# Patient Record
Sex: Female | Born: 1980 | Race: White | Hispanic: No | Marital: Married | Smoking: Never smoker
Health system: Southern US, Community
[De-identification: ages and names within clinical notes are randomized; demographics above are authoritative.]

## PROBLEM LIST (undated history)

## (undated) DIAGNOSIS — Z8619 Personal history of other infectious and parasitic diseases: Secondary | ICD-10-CM

## (undated) HISTORY — DX: Personal history of other infectious and parasitic diseases: Z86.19

## (undated) HISTORY — PX: TONSILLECTOMY: SUR1361

---

## 2007-05-24 ENCOUNTER — Emergency Department (HOSPITAL_COMMUNITY): Admission: EM | Admit: 2007-05-24 | Discharge: 2007-05-24 | Payer: Self-pay | Admitting: Emergency Medicine

## 2008-11-21 ENCOUNTER — Inpatient Hospital Stay (HOSPITAL_COMMUNITY): Admission: AD | Admit: 2008-11-21 | Discharge: 2008-11-23 | Payer: Self-pay | Admitting: Obstetrics and Gynecology

## 2008-11-22 ENCOUNTER — Encounter (INDEPENDENT_AMBULATORY_CARE_PROVIDER_SITE_OTHER): Payer: Self-pay | Admitting: Obstetrics and Gynecology

## 2010-04-04 ENCOUNTER — Other Ambulatory Visit: Payer: Self-pay | Admitting: Obstetrics and Gynecology

## 2010-04-04 ENCOUNTER — Ambulatory Visit (HOSPITAL_COMMUNITY)
Admission: AD | Admit: 2010-04-04 | Discharge: 2010-04-04 | Disposition: A | Discharge status: Home / Self Care | Payer: BC Managed Care – PPO | Source: Ambulatory Visit | Attending: Obstetrics and Gynecology | Admitting: Obstetrics and Gynecology

## 2010-04-04 DIAGNOSIS — O034 Incomplete spontaneous abortion without complication: Secondary | ICD-10-CM | POA: Insufficient documentation

## 2010-04-04 LAB — ABO/RH: ABO/RH(D): A NEG

## 2010-04-04 LAB — CBC
Hemoglobin: 13.6 g/dL (ref 12.0–15.0)
RBC: 4.46 MIL/uL (ref 3.87–5.11)

## 2010-04-04 LAB — HCG, QUANTITATIVE, PREGNANCY: hCG, Beta Chain, Quant, S: 14385 m[IU]/mL — ABNORMAL HIGH (ref <5)

## 2010-04-05 LAB — RH IMMUNE GLOB WKUP(>/=20WKS)(NOT WOMEN'S HOSP): Antibody Screen: NEGATIVE

## 2010-04-08 NOTE — Op Note (Signed)
  NAMELASHAUNTA, SICARD                ACCOUNT NO.:  [id number]  MEDICAL RECORD NO.:  [id number]           PATIENT TYPE:  O  LOCATION:  [facility]                          [facility]  PHYSICIAN:  Hensley Treat L. Kameshia Madruga, M.D.DATE OF BIRTH:  Mar 15, 1980  DATE OF PROCEDURE: DATE OF DISCHARGE:                              OPERATIVE REPORT   PREOPERATIVE DIAGNOSIS:  Incomplete abortion, Rh negative.  POSTOPERATIVE DIAGNOSIS:  Incomplete abortion, Rh negative.  PROCEDURE:  Dilatation and aspiration  SURGEON:  Aydan Levitz L. Vincente Poli, MD  ANESTHESIA:  Spinal with local.  FINDINGS:  Products of conception sent to Pathology that is specimen.  ESTIMATED BLOOD LOSS:  Minimal.  COMPLICATIONS:  None.  DESCRIPTION OF PROCEDURE:  The patient was taken to the operating room. She was given a spinal by Dr. Jean Rosenthal.  She was prepped and draped in the usual sterile fashion.  In-and-out catheter was used to empty the bladder.  There is a large amount of blood and clots in the vagina.  The cervix was open about 1 cm.  She was actively bleeding in a moderate to heavy fashion.  A paracervical block was performed a #7 suction cannula was inserted to the uterus.  The uterus was thoroughly suctioned of moderate amount of tissue consistent with products of conception.  The curette was removed and a sharp curette was inserted and the uterus was thoroughly curetted of all tissue.  A final suction curettage revealed a clean endometrial cavity.  All instruments removed from the vagina and bleeding was minimal to none.  The patient was taken to the recovery room in stable condition.  All sponge, lap, and instrument counts were correct x2.     Stillman Buenger L. Vincente Poli, M.D.     Florestine Avers  D:  04/04/2010  T:  04/05/2010  Job:  [id number]  Electronically Signed by Marcelle Overlie M.D. on 04/08/2010 07:11:27 AM

## 2010-04-17 LAB — COMPREHENSIVE METABOLIC PANEL
Alkaline Phosphatase: 119 U/L — ABNORMAL HIGH (ref 39–117)
BUN: 12 mg/dL (ref 6–23)
Calcium: 9.4 mg/dL (ref 8.4–10.5)
Glucose, Bld: 80 mg/dL (ref 70–99)
Potassium: 4.2 mEq/L (ref 3.5–5.1)
Total Protein: 5.7 g/dL — ABNORMAL LOW (ref 6.0–8.3)

## 2010-04-17 LAB — CBC
HCT: 38.1 % (ref 36.0–46.0)
Hemoglobin: 13.2 g/dL (ref 12.0–15.0)
MCHC: 34.4 g/dL (ref 30.0–36.0)
MCHC: 34.5 g/dL (ref 30.0–36.0)
MCV: 95.9 fL (ref 78.0–100.0)
MCV: 98.3 fL (ref 78.0–100.0)
RBC: 3.16 MIL/uL — ABNORMAL LOW (ref 3.87–5.11)
RBC: 3.89 MIL/uL (ref 3.87–5.11)
RDW: 13.3 % (ref 11.5–15.5)
WBC: 12.8 10*3/uL — ABNORMAL HIGH (ref 4.0–10.5)

## 2010-04-17 LAB — RPR: RPR Ser Ql: NONREACTIVE

## 2010-04-17 LAB — LACTATE DEHYDROGENASE: LDH: 197 U/L (ref 94–250)

## 2011-02-10 ENCOUNTER — Ambulatory Visit (INDEPENDENT_AMBULATORY_CARE_PROVIDER_SITE_OTHER): Payer: BC Managed Care – PPO

## 2011-02-10 DIAGNOSIS — J019 Acute sinusitis, unspecified: Secondary | ICD-10-CM

## 2011-02-10 DIAGNOSIS — H6120 Impacted cerumen, unspecified ear: Secondary | ICD-10-CM

## 2011-02-10 DIAGNOSIS — J111 Influenza due to unidentified influenza virus with other respiratory manifestations: Secondary | ICD-10-CM

## 2011-04-15 LAB — OB RESULTS CONSOLE GC/CHLAMYDIA
Chlamydia: NEGATIVE
Gonorrhea: NEGATIVE

## 2011-04-15 LAB — OB RESULTS CONSOLE HIV ANTIBODY (ROUTINE TESTING): HIV: NONREACTIVE

## 2011-04-15 LAB — OB RESULTS CONSOLE RPR: RPR: NONREACTIVE

## 2011-10-30 LAB — OB RESULTS CONSOLE GBS: GBS: POSITIVE

## 2011-11-10 ENCOUNTER — Encounter (HOSPITAL_COMMUNITY): Payer: Self-pay | Admitting: *Deleted

## 2011-11-10 ENCOUNTER — Telehealth (HOSPITAL_COMMUNITY): Payer: Self-pay | Admitting: *Deleted

## 2011-11-10 NOTE — Telephone Encounter (Signed)
Preadmission screen  

## 2011-11-14 ENCOUNTER — Inpatient Hospital Stay (HOSPITAL_COMMUNITY): Admission: AD | Admit: 2011-11-14 | Payer: Self-pay | Source: Ambulatory Visit | Admitting: Obstetrics and Gynecology

## 2011-11-18 ENCOUNTER — Encounter (HOSPITAL_COMMUNITY): Payer: Self-pay

## 2011-11-18 ENCOUNTER — Inpatient Hospital Stay (HOSPITAL_COMMUNITY)
Admission: RE | Admit: 2011-11-18 | Discharge: 2011-11-19 | DRG: 373 | Disposition: A | Discharge status: Home / Self Care | Payer: BC Managed Care – PPO | Source: Ambulatory Visit | Attending: Obstetrics and Gynecology | Admitting: Obstetrics and Gynecology

## 2011-11-18 DIAGNOSIS — O99892 Other specified diseases and conditions complicating childbirth: Secondary | ICD-10-CM | POA: Diagnosis present

## 2011-11-18 DIAGNOSIS — Z22330 Carrier of Group B streptococcus: Secondary | ICD-10-CM

## 2011-11-18 LAB — CBC
HCT: 37 % (ref 36.0–46.0)
MCH: 32.6 pg (ref 26.0–34.0)
MCV: 92.7 fL (ref 78.0–100.0)
Platelets: 147 10*3/uL — ABNORMAL LOW (ref 150–400)
RDW: 13.1 % (ref 11.5–15.5)

## 2011-11-18 MED ORDER — BENZOCAINE-MENTHOL 20-0.5 % EX AERO: 1.0000 "application " | INHALATION_SPRAY | CUTANEOUS | Status: DC | PRN

## 2011-11-18 MED ORDER — OXYTOCIN 40 UNITS IN LACTATED RINGERS INFUSION - SIMPLE MED: 62.5000 mL/h | INTRAVENOUS | Status: DC

## 2011-11-18 MED ORDER — ONDANSETRON HCL 4 MG/2ML IJ SOLN: 4.0000 mg | Freq: Four times a day (QID) | INTRAMUSCULAR | Status: DC | PRN

## 2011-11-18 MED ORDER — OXYCODONE-ACETAMINOPHEN 5-325 MG PO TABS: 1.0000 | ORAL_TABLET | ORAL | Status: DC | PRN

## 2011-11-18 MED ORDER — ACETAMINOPHEN 325 MG PO TABS: 650.0000 mg | ORAL_TABLET | ORAL | Status: DC | PRN

## 2011-11-18 MED ORDER — LANOLIN HYDROUS EX OINT: TOPICAL_OINTMENT | CUTANEOUS | Status: DC | PRN

## 2011-11-18 MED ORDER — TERBUTALINE SULFATE 1 MG/ML IJ SOLN: 0.2500 mg | Freq: Once | INTRAMUSCULAR | Status: DC | PRN

## 2011-11-18 MED ORDER — LACTATED RINGERS IV SOLN: 500.0000 mL | INTRAVENOUS | Status: DC | PRN

## 2011-11-18 MED ORDER — MEASLES, MUMPS & RUBELLA VAC ~~LOC~~ INJ: 0.5000 mL | INJECTION | Freq: Once | SUBCUTANEOUS | Status: DC

## 2011-11-18 MED ORDER — ONDANSETRON HCL 4 MG/2ML IJ SOLN: 4.0000 mg | INTRAMUSCULAR | Status: DC | PRN

## 2011-11-18 MED ORDER — ONDANSETRON HCL 4 MG PO TABS: 4.0000 mg | ORAL_TABLET | ORAL | Status: DC | PRN

## 2011-11-18 MED ORDER — LACTATED RINGERS IV SOLN: INTRAVENOUS | Status: DC

## 2011-11-18 MED ORDER — DIPHENHYDRAMINE HCL 25 MG PO CAPS: 25.0000 mg | ORAL_CAPSULE | Freq: Four times a day (QID) | ORAL | Status: DC | PRN

## 2011-11-18 MED ORDER — WITCH HAZEL-GLYCERIN EX PADS: 1.0000 "application " | MEDICATED_PAD | CUTANEOUS | Status: DC | PRN

## 2011-11-18 MED ORDER — PRENATAL MULTIVITAMIN CH
1.0000 | ORAL_TABLET | Freq: Every day | ORAL | Status: DC
  Administered 2011-11-19: 1 via ORAL
  Filled 2011-11-18: qty 1

## 2011-11-18 MED ORDER — IBUPROFEN 600 MG PO TABS
600.0000 mg | ORAL_TABLET | Freq: Four times a day (QID) | ORAL | Status: DC
  Administered 2011-11-19: 600 mg via ORAL
  Filled 2011-11-18 (×2): qty 1

## 2011-11-18 MED ORDER — PENICILLIN G POTASSIUM 5000000 UNITS IJ SOLR
2.5000 10*6.[IU] | INTRAVENOUS | Status: DC
  Administered 2011-11-18: 2.5 10*6.[IU] via INTRAVENOUS
  Filled 2011-11-18 (×5): qty 2.5

## 2011-11-18 MED ORDER — SENNOSIDES-DOCUSATE SODIUM 8.6-50 MG PO TABS: 2.0000 | ORAL_TABLET | Freq: Every day | ORAL | Status: DC

## 2011-11-18 MED ORDER — OXYTOCIN 40 UNITS IN LACTATED RINGERS INFUSION - SIMPLE MED
1.0000 m[IU]/min | INTRAVENOUS | Status: DC
  Administered 2011-11-18: 2 m[IU]/min via INTRAVENOUS
  Administered 2011-11-18: 4 m[IU]/min via INTRAVENOUS
  Filled 2011-11-18: qty 1000

## 2011-11-18 MED ORDER — PENICILLIN G POTASSIUM 5000000 UNITS IJ SOLR
5.0000 10*6.[IU] | Freq: Once | INTRAVENOUS | Status: AC
  Administered 2011-11-18: 5 10*6.[IU] via INTRAVENOUS
  Filled 2011-11-18: qty 5

## 2011-11-18 MED ORDER — MEDROXYPROGESTERONE ACETATE 150 MG/ML IM SUSP: 150.0000 mg | INTRAMUSCULAR | Status: DC | PRN

## 2011-11-18 MED ORDER — SIMETHICONE 80 MG PO CHEW: 80.0000 mg | CHEWABLE_TABLET | ORAL | Status: DC | PRN

## 2011-11-18 MED ORDER — OXYTOCIN BOLUS FROM INFUSION
500.0000 mL | INTRAVENOUS | Status: DC
Start: 2011-11-18 — End: 2011-11-18

## 2011-11-18 MED ORDER — PROMETHAZINE HCL 25 MG/ML IJ SOLN: 12.5000 mg | Freq: Four times a day (QID) | INTRAMUSCULAR | Status: DC | PRN

## 2011-11-18 MED ORDER — LIDOCAINE HCL (PF) 1 % IJ SOLN
30.0000 mL | INTRAMUSCULAR | Status: DC | PRN
  Filled 2011-11-18: qty 30

## 2011-11-18 MED ORDER — IBUPROFEN 600 MG PO TABS
600.0000 mg | ORAL_TABLET | Freq: Four times a day (QID) | ORAL | Status: DC | PRN
  Administered 2011-11-18: 600 mg via ORAL
  Filled 2011-11-18: qty 1

## 2011-11-18 MED ORDER — BUTORPHANOL TARTRATE 1 MG/ML IJ SOLN: 1.0000 mg | INTRAMUSCULAR | Status: DC | PRN

## 2011-11-18 MED ORDER — TETANUS-DIPHTH-ACELL PERTUSSIS 5-2.5-18.5 LF-MCG/0.5 IM SUSP: 0.5000 mL | Freq: Once | INTRAMUSCULAR | Status: DC

## 2011-11-18 MED ORDER — CITRIC ACID-SODIUM CITRATE 334-500 MG/5ML PO SOLN: 30.0000 mL | ORAL | Status: DC | PRN

## 2011-11-18 MED ORDER — DIBUCAINE 1 % RE OINT: 1.0000 "application " | TOPICAL_OINTMENT | RECTAL | Status: DC | PRN

## 2011-11-18 NOTE — Progress Notes (Signed)
Pt getting more uncomfortable w/ ctx.  Declines epidural but desires IV pain meds  FHT reassuring toco q2-3 Cvx 3cm (1hr ago per RN exam)  A/P:  Continue pitocin IOL Stadol prn

## 2011-11-18 NOTE — H&P (Signed)
31 yo G3P1 @ 39+3 wks presents for IOL because of elevating BP & favorable cervix.  BP in office 130-140/90s No HA, N/V or visual changes.  No regular ctx.  No lof or vb.  + FM  Past History - see hollister, GBS +  H/o SVD  AF, VSS  BP 130/90 Gen - NAD Abd - gravid, NT  EFW 7 1/2# Ext -  2-3+edema, DTR 2+, 1 beat clonus CVX - 3cm, arom - clear  A/P:  Admit AROM - clear Pitocin prn  PCN

## 2011-11-18 NOTE — Plan of Care (Signed)
Problem: Phase I Progression Outcomes Goal: Initial discharge plan identified Outcome: Completed/Met Date Met:  11/18/11 Parents wish to go home with newborn infant tonight and are waiting for pediatrician to check infant. OB MD notified that they wish to leave tonight possibly AMA. Goal: Other Phase I Outcomes/Goals Outcome: Progressing Patients feet and ankles are swollen and admitting B/P elevated.

## 2011-11-18 NOTE — Progress Notes (Signed)
Father and mother request discharge ASAP and do not want to wait until 24 hrs.
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
## 2011-11-18 NOTE — Progress Notes (Signed)
Pt feeling increasing pressure w/ ctx  FHT reassuring w/ small early decels.  Good BTBV Toco Q2 Cvx 8-9/90/0  A/P:  Exp mngt

## 2011-11-19 LAB — CBC
MCV: 93.2 fL (ref 78.0–100.0)
Platelets: 147 10*3/uL — ABNORMAL LOW (ref 150–400)
RBC: 3.84 MIL/uL — ABNORMAL LOW (ref 3.87–5.11)
RDW: 13.2 % (ref 11.5–15.5)
WBC: 14.3 10*3/uL — ABNORMAL HIGH (ref 4.0–10.5)

## 2011-11-19 NOTE — Progress Notes (Signed)
Post Partum Day 1 Subjective: no complaints, up ad lib, voiding, tolerating PO and + flatus. States husband is calmer. A friend plans to pick up patient today for discharge. Desires baby circ  Objective: Blood pressure 130/77, pulse 88, temperature 98.3 F (36.8 C), temperature source Oral, resp. rate 18, height 5\' 4"  (1.626 m), last menstrual period 02/15/2011, unknown if currently breastfeeding.  Physical Exam:  General: alert and cooperative Lochia: appropriate Uterine Fundus: firm Incision: perineum intact DVT Evaluation: No evidence of DVT seen on physical exam. No cords or calf tenderness. No significant calf/ankle edema.   Basename 11/19/11 0530 11/18/11 0725  HGB 12.5 13.0  HCT 35.8* 37.0    Assessment/Plan: Discharge home   LOS: 1 day   CURTIS,CAROL G 11/19/2011, 8:41 AM

## 2011-11-19 NOTE — Discharge Summary (Signed)
Obstetric Discharge Summary Reason for Admission: induction of labor Prenatal Procedures: NST and ultrasound Intrapartum Procedures: spontaneous vaginal delivery Postpartum Procedures: none Complications-Operative and Postpartum: 2 degree perineal laceration Hemoglobin  Date Value Range Status  11/19/2011 12.5  12.0 - 15.0 g/dL Final     HCT  Date Value Range Status  11/19/2011 35.8* 36.0 - 46.0 % Final    Physical Exam:  General: alert and cooperative Lochia: appropriate Uterine Fundus: firm Incision: perineum intact DVT Evaluation: No evidence of DVT seen on physical exam. Negative Homan's sign. No cords or calf tenderness. No significant calf/ankle edema.  Discharge Diagnoses: Term Pregnancy-delivered  Discharge Information: Date: 11/19/2011 Activity: pelvic rest Diet: routine Medications: PNV and Ibuprofen Condition: stable Instructions: refer to practice specific booklet Discharge to: home   Newborn Data: Live born female  Birth Weight: 7 lb 10 oz (3459 g) APGAR: 9, 9  Home with mother.  CURTIS,CAROL G 11/19/2011, 8:45 AM
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
## 2013-11-14 ENCOUNTER — Encounter (HOSPITAL_COMMUNITY): Payer: Self-pay

## 2016-06-05 DIAGNOSIS — Z0000 Encounter for general adult medical examination without abnormal findings: Secondary | ICD-10-CM | POA: Diagnosis not present

## 2016-06-05 DIAGNOSIS — Z131 Encounter for screening for diabetes mellitus: Secondary | ICD-10-CM | POA: Diagnosis not present

## 2016-06-05 DIAGNOSIS — Z01419 Encounter for gynecological examination (general) (routine) without abnormal findings: Secondary | ICD-10-CM | POA: Diagnosis not present

## 2016-06-05 DIAGNOSIS — Z6829 Body mass index (BMI) 29.0-29.9, adult: Secondary | ICD-10-CM | POA: Diagnosis not present

## 2016-06-05 DIAGNOSIS — Z1329 Encounter for screening for other suspected endocrine disorder: Secondary | ICD-10-CM | POA: Diagnosis not present

## 2016-06-05 DIAGNOSIS — Z130 Encounter for screening for diseases of the blood and blood-forming organs and certain disorders involving the immune mechanism: Secondary | ICD-10-CM | POA: Diagnosis not present

## 2016-06-05 DIAGNOSIS — Z13220 Encounter for screening for lipoid disorders: Secondary | ICD-10-CM | POA: Diagnosis not present

## 2016-06-05 DIAGNOSIS — Z1151 Encounter for screening for human papillomavirus (HPV): Secondary | ICD-10-CM | POA: Diagnosis not present

## 2016-07-24 DIAGNOSIS — M79621 Pain in right upper arm: Secondary | ICD-10-CM | POA: Diagnosis not present

## 2016-07-24 DIAGNOSIS — L249 Irritant contact dermatitis, unspecified cause: Secondary | ICD-10-CM | POA: Diagnosis not present

## 2016-07-24 DIAGNOSIS — M25511 Pain in right shoulder: Secondary | ICD-10-CM | POA: Diagnosis not present

## 2016-07-24 DIAGNOSIS — W57XXXA Bitten or stung by nonvenomous insect and other nonvenomous arthropods, initial encounter: Secondary | ICD-10-CM | POA: Diagnosis not present

## 2017-02-25 DIAGNOSIS — Z30433 Encounter for removal and reinsertion of intrauterine contraceptive device: Secondary | ICD-10-CM | POA: Diagnosis not present

## 2017-02-25 DIAGNOSIS — Z3202 Encounter for pregnancy test, result negative: Secondary | ICD-10-CM | POA: Diagnosis not present

## 2017-04-08 DIAGNOSIS — Z6829 Body mass index (BMI) 29.0-29.9, adult: Secondary | ICD-10-CM | POA: Diagnosis not present

## 2017-04-08 DIAGNOSIS — Z01419 Encounter for gynecological examination (general) (routine) without abnormal findings: Secondary | ICD-10-CM | POA: Diagnosis not present

## 2017-12-23 DIAGNOSIS — R590 Localized enlarged lymph nodes: Secondary | ICD-10-CM | POA: Diagnosis not present

## 2018-02-09 ENCOUNTER — Other Ambulatory Visit: Payer: Self-pay | Admitting: Obstetrics

## 2018-02-09 DIAGNOSIS — R52 Pain, unspecified: Secondary | ICD-10-CM

## 2018-02-09 DIAGNOSIS — R609 Edema, unspecified: Secondary | ICD-10-CM

## 2018-02-12 ENCOUNTER — Ambulatory Visit
Admission: RE | Admit: 2018-02-12 | Discharge: 2018-02-12 | Disposition: A | Discharge status: Home / Self Care | Payer: BLUE CROSS/BLUE SHIELD | Source: Ambulatory Visit | Attending: Obstetrics | Admitting: Obstetrics

## 2018-02-12 DIAGNOSIS — R52 Pain, unspecified: Secondary | ICD-10-CM

## 2018-02-12 DIAGNOSIS — R609 Edema, unspecified: Secondary | ICD-10-CM

## 2018-02-12 DIAGNOSIS — N6489 Other specified disorders of breast: Secondary | ICD-10-CM | POA: Diagnosis not present

## 2018-02-12 DIAGNOSIS — R922 Inconclusive mammogram: Secondary | ICD-10-CM | POA: Diagnosis not present

## 2018-07-26 DIAGNOSIS — W57XXXA Bitten or stung by nonvenomous insect and other nonvenomous arthropods, initial encounter: Secondary | ICD-10-CM | POA: Diagnosis not present

## 2018-07-26 DIAGNOSIS — L237 Allergic contact dermatitis due to plants, except food: Secondary | ICD-10-CM | POA: Diagnosis not present

## 2018-07-26 DIAGNOSIS — L03115 Cellulitis of right lower limb: Secondary | ICD-10-CM | POA: Diagnosis not present

## 2018-09-23 DIAGNOSIS — M7582 Other shoulder lesions, left shoulder: Secondary | ICD-10-CM | POA: Diagnosis not present

## 2019-10-18 IMAGING — MG DIGITAL DIAGNOSTIC BILATERAL MAMMOGRAM WITH TOMO AND CAD
6 of 10 series · 6 of 30 positions shown · non-contrast
Comparison: Previous exam(s).

CLINICAL DATA: 38-year-old female with right axillary swelling and
tenderness for approximately 1 month which has since improved.

EXAM:
DIGITAL DIAGNOSTIC BILATERAL MAMMOGRAM WITH CAD AND TOMO
ULTRASOUND RIGHT BREAST

[R MLO synth-2D]
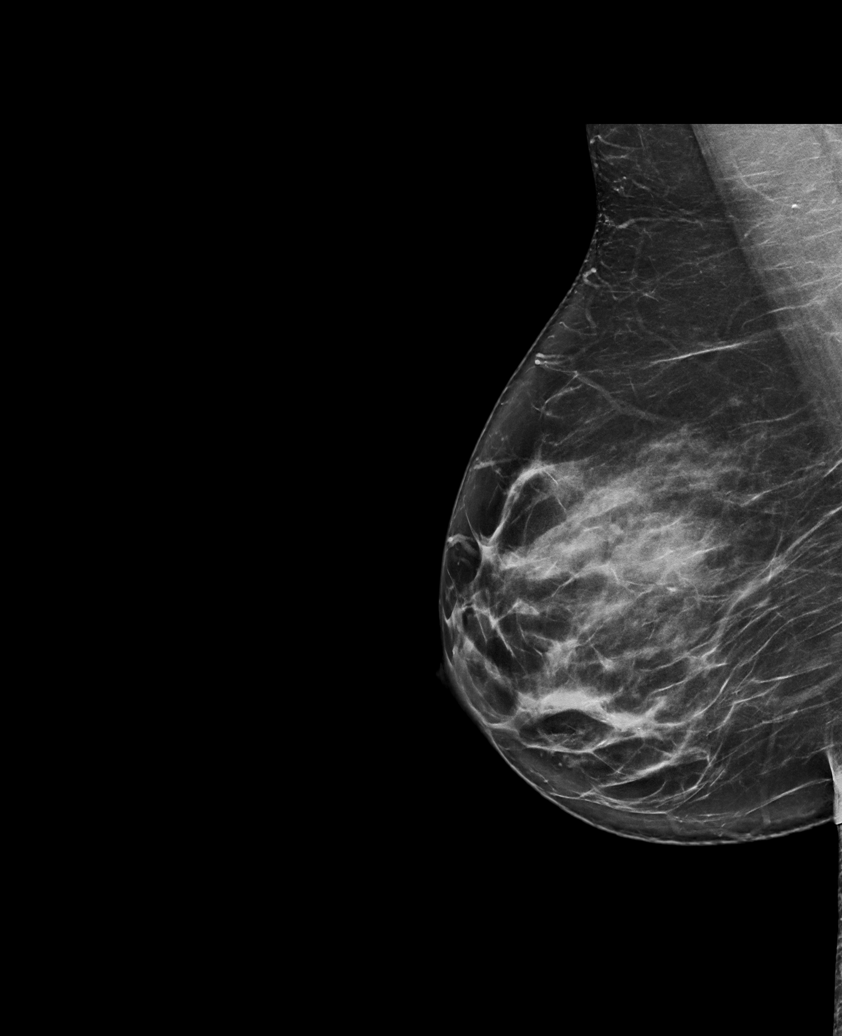

[R CC synth-2D]
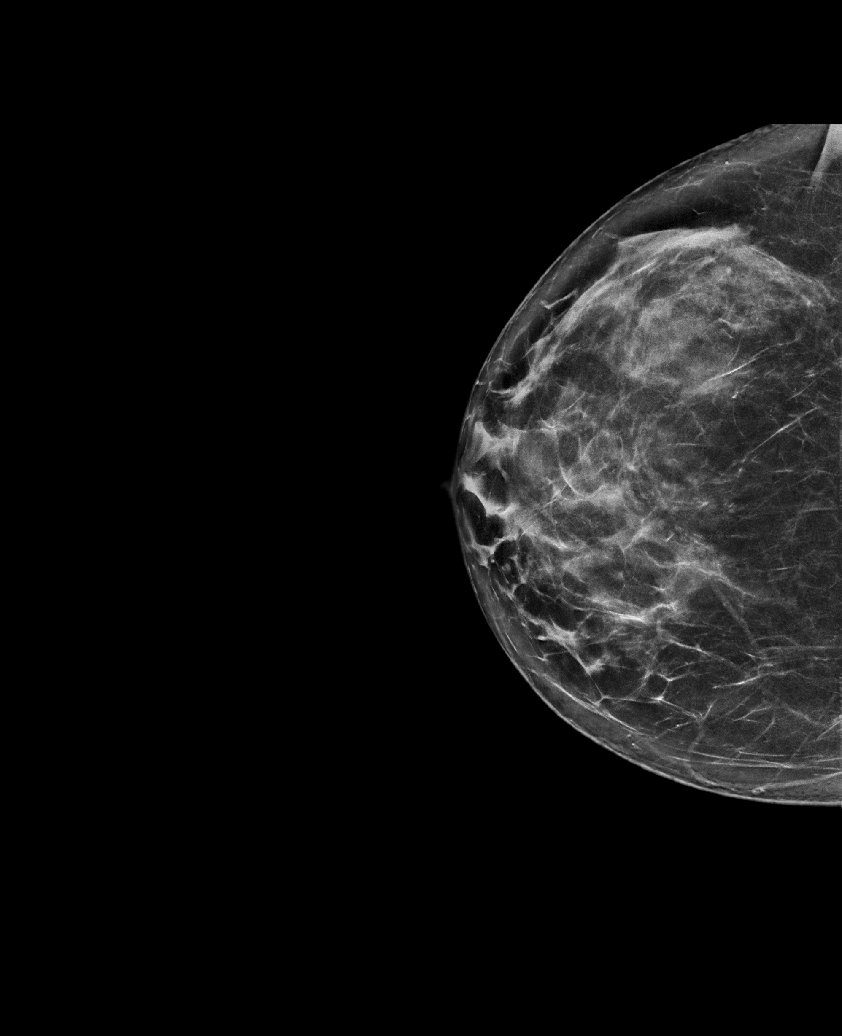

[L CC synth-2D]
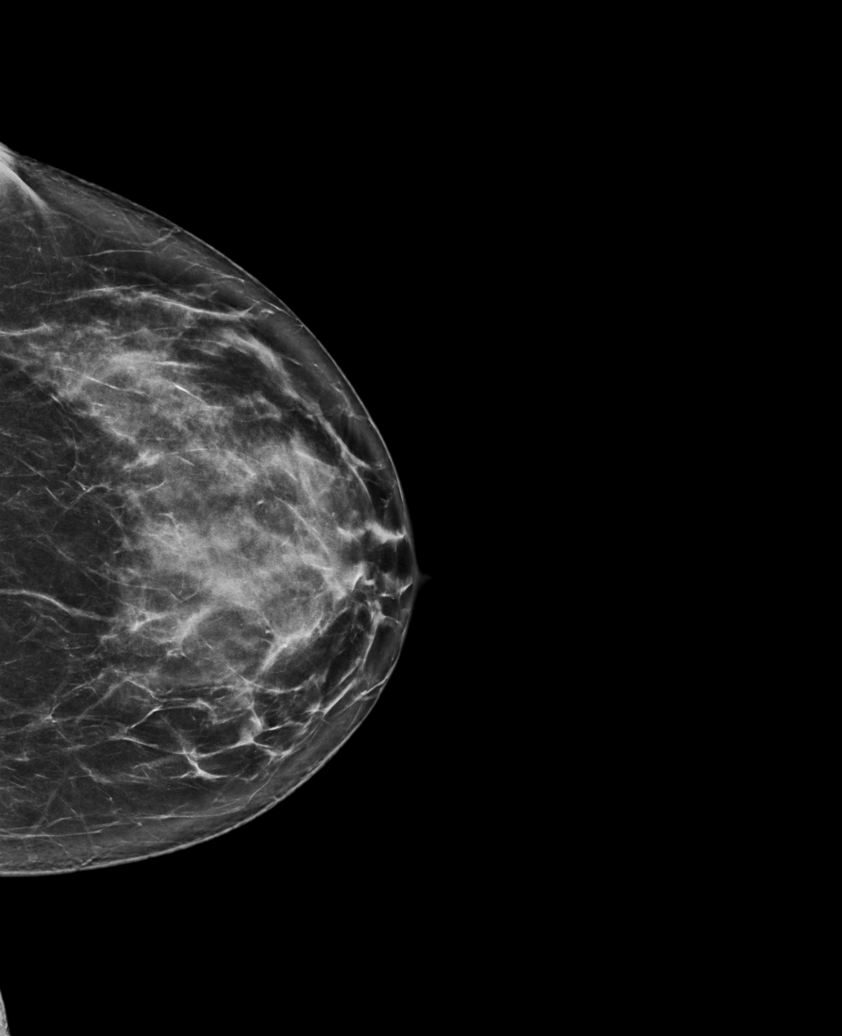

[R TAN synth-2D]
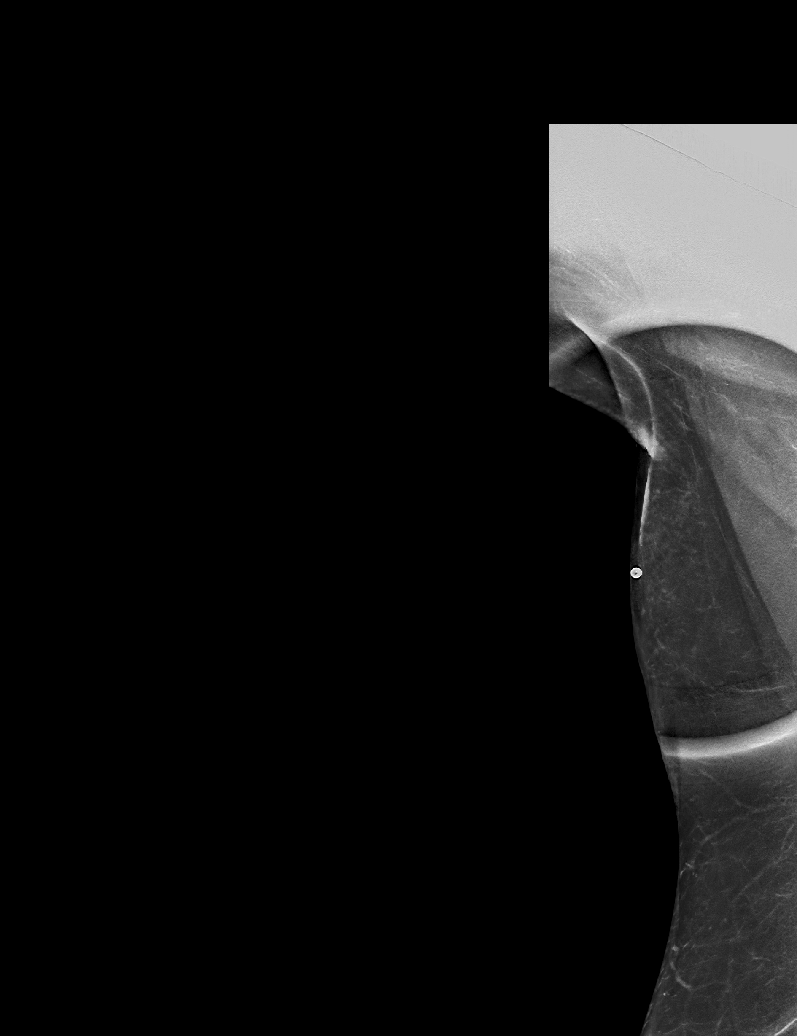

[L MLO synth-2D]
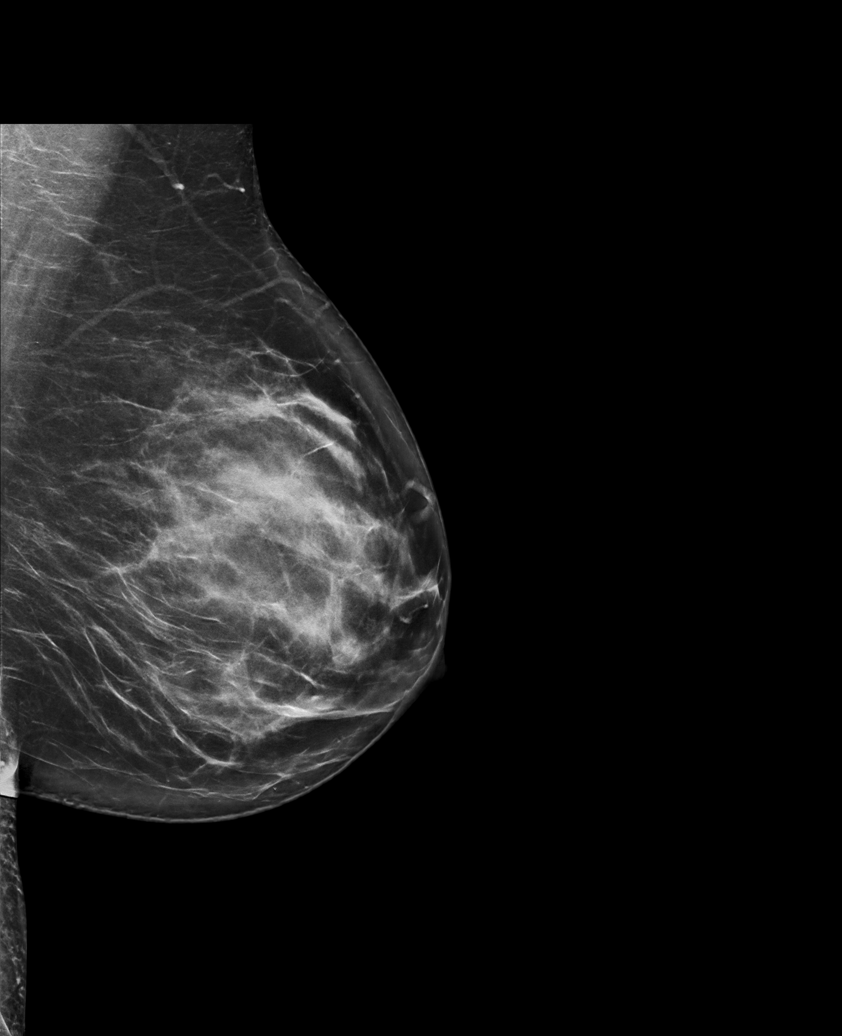

[L MLO tomo · tomo slice 45/88.0]
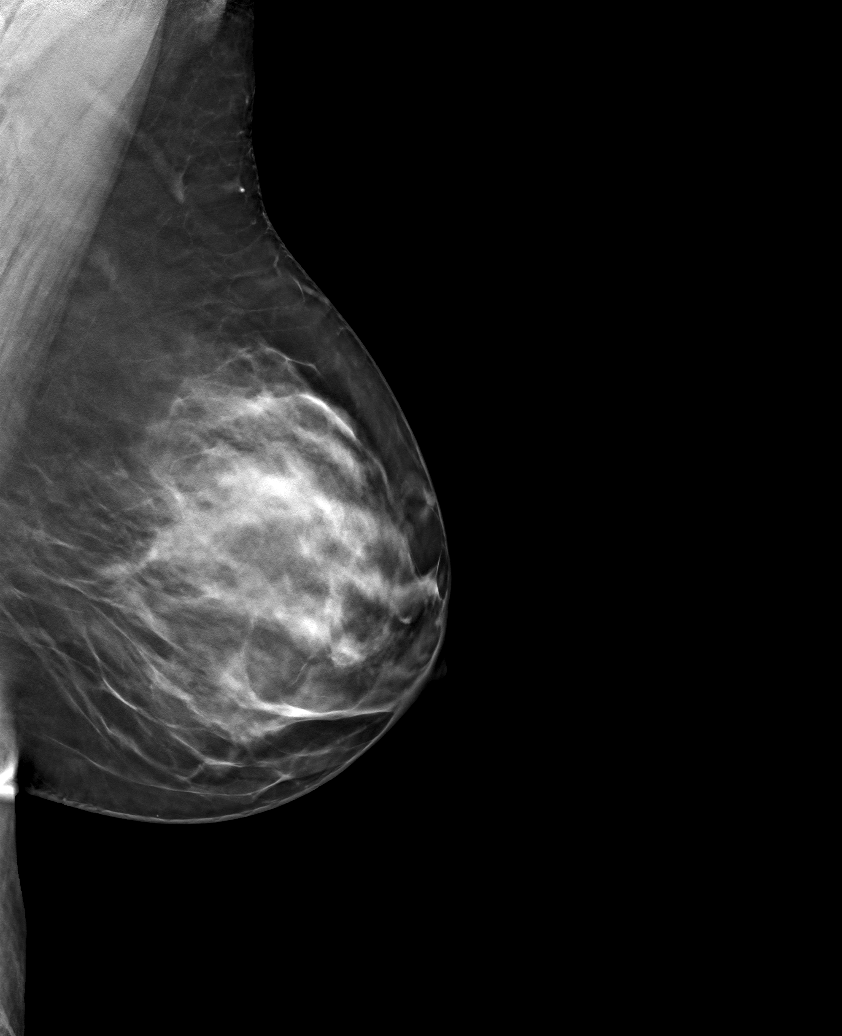

[6 of 30 positions shown; findings below may reference images not displayed]

ACR Breast Density Category c: The breast tissue is heterogeneously
dense, which may obscure small masses.
FINDINGS: Radiopaque BB was placed at the site of the patient's clinical
symptoms in the right axilla. No suspicious findings are seen deep
to the radiopaque BB or within the remainder of either breast. The
parenchymal pattern is stable.

Mammographic images were processed with CAD.

On physical exam, I palpate no suspicious lumps in the right axilla.

Targeted ultrasound is performed, showing morphologically normal
lymph nodes and unremarkable soft tissue within the right axilla. No
focal or suspicious sonographic findings are identified.
IMPRESSION: 1. No mammographic evidence of malignancy in either breast.
2. No suspicious mammographic or sonographic findings corresponding
to the patient's right axillary symptoms.

RECOMMENDATION:
1. Clinical follow-up recommended for the symptomatic area of
concern in the right axilla. Any further workup should be based on
clinical grounds.
2.  Screening mammogram in one year.(Code:TI-A-NLT)

I have discussed the findings and recommendations with the patient.
Results were also provided in writing at the conclusion of the
visit. If applicable, a reminder letter will be sent to the patient
regarding the next appointment.

BI-RADS CATEGORY  1: Negative.

## 2020-03-26 DIAGNOSIS — N390 Urinary tract infection, site not specified: Secondary | ICD-10-CM | POA: Diagnosis not present

## 2020-05-12 ENCOUNTER — Emergency Department (HOSPITAL_BASED_OUTPATIENT_CLINIC_OR_DEPARTMENT_OTHER): Payer: BC Managed Care – PPO | Admitting: Radiology

## 2020-05-12 ENCOUNTER — Encounter (HOSPITAL_BASED_OUTPATIENT_CLINIC_OR_DEPARTMENT_OTHER): Payer: Self-pay

## 2020-05-12 ENCOUNTER — Emergency Department (HOSPITAL_BASED_OUTPATIENT_CLINIC_OR_DEPARTMENT_OTHER)
Admission: EM | Admit: 2020-05-12 | Discharge: 2020-05-12 | Disposition: A | Discharge status: Home / Self Care | Payer: BC Managed Care – PPO | Attending: Emergency Medicine | Admitting: Emergency Medicine

## 2020-05-12 ENCOUNTER — Other Ambulatory Visit: Payer: Self-pay

## 2020-05-12 DIAGNOSIS — Z20822 Contact with and (suspected) exposure to covid-19: Secondary | ICD-10-CM | POA: Diagnosis not present

## 2020-05-12 DIAGNOSIS — R000 Tachycardia, unspecified: Secondary | ICD-10-CM | POA: Diagnosis not present

## 2020-05-12 DIAGNOSIS — J181 Lobar pneumonia, unspecified organism: Secondary | ICD-10-CM | POA: Diagnosis not present

## 2020-05-12 DIAGNOSIS — J189 Pneumonia, unspecified organism: Secondary | ICD-10-CM

## 2020-05-12 DIAGNOSIS — R0602 Shortness of breath: Secondary | ICD-10-CM | POA: Diagnosis not present

## 2020-05-12 DIAGNOSIS — R059 Cough, unspecified: Secondary | ICD-10-CM

## 2020-05-12 LAB — CBC WITH DIFFERENTIAL/PLATELET
Abs Immature Granulocytes: 0.12 10*3/uL — ABNORMAL HIGH (ref 0.00–0.07)
Basophils Absolute: 0 10*3/uL (ref 0.0–0.1)
Basophils Relative: 0 %
Eosinophils Absolute: 0 10*3/uL (ref 0.0–0.5)
Eosinophils Relative: 0 %
HCT: 35.4 % — ABNORMAL LOW (ref 36.0–46.0)
Hemoglobin: 12.4 g/dL (ref 12.0–15.0)
Immature Granulocytes: 1 %
Lymphocytes Relative: 4 %
Lymphs Abs: 0.7 10*3/uL (ref 0.7–4.0)
MCH: 32 pg (ref 26.0–34.0)
MCHC: 35 g/dL (ref 30.0–36.0)
MCV: 91.5 fL (ref 80.0–100.0)
Monocytes Absolute: 1.2 10*3/uL — ABNORMAL HIGH (ref 0.1–1.0)
Monocytes Relative: 6 %
Neutro Abs: 17.1 10*3/uL — ABNORMAL HIGH (ref 1.7–7.7)
Neutrophils Relative %: 89 %
Platelets: 169 10*3/uL (ref 150–400)
RBC: 3.87 MIL/uL (ref 3.87–5.11)
RDW: 11.8 % (ref 11.5–15.5)
WBC: 19.2 10*3/uL — ABNORMAL HIGH (ref 4.0–10.5)
nRBC: 0 % (ref 0.0–0.2)

## 2020-05-12 LAB — URINALYSIS, ROUTINE W REFLEX MICROSCOPIC
Bilirubin Urine: NEGATIVE
Glucose, UA: NEGATIVE mg/dL
Hgb urine dipstick: NEGATIVE
Ketones, ur: 15 mg/dL — AB
Leukocytes,Ua: NEGATIVE
Nitrite: NEGATIVE
Specific Gravity, Urine: <1.005 — ABNORMAL LOW (ref 1.005–1.030)
pH: 6 (ref 5.0–8.0)

## 2020-05-12 LAB — BASIC METABOLIC PANEL
Anion gap: 11 (ref 5–15)
BUN: 11 mg/dL (ref 6–20)
CO2: 23 mmol/L (ref 22–32)
Calcium: 8.8 mg/dL — ABNORMAL LOW (ref 8.9–10.3)
Chloride: 100 mmol/L (ref 98–111)
Creatinine, Ser: 0.89 mg/dL (ref 0.44–1.00)
GFR, Estimated: >60 mL/min (ref >60)
Glucose, Bld: 130 mg/dL — ABNORMAL HIGH (ref 70–99)
Potassium: 3.2 mmol/L — ABNORMAL LOW (ref 3.5–5.1)
Sodium: 134 mmol/L — ABNORMAL LOW (ref 135–145)

## 2020-05-12 LAB — RESP PANEL BY RT-PCR (FLU A&B, COVID) ARPGX2
Influenza A by PCR: NEGATIVE
Influenza B by PCR: NEGATIVE
SARS Coronavirus 2 by RT PCR: NEGATIVE

## 2020-05-12 LAB — PREGNANCY, URINE: Preg Test, Ur: NEGATIVE

## 2020-05-12 MED ORDER — ALBUTEROL SULFATE HFA 108 (90 BASE) MCG/ACT IN AERS: 4.0000 | INHALATION_SPRAY | Freq: Once | RESPIRATORY_TRACT | Status: AC

## 2020-05-12 MED ORDER — SODIUM CHLORIDE 0.9 % IV BOLUS
500.0000 mL | Freq: Once | INTRAVENOUS | Status: AC
  Administered 2020-05-12: 500 mL via INTRAVENOUS

## 2020-05-12 MED ORDER — ONDANSETRON 4 MG PO TBDP
4.0000 mg | ORAL_TABLET | Freq: Once | ORAL | Status: AC | PRN
  Administered 2020-05-12: 4 mg via ORAL
  Filled 2020-05-12: qty 1

## 2020-05-12 MED ORDER — ACETAMINOPHEN 325 MG PO TABS
650.0000 mg | ORAL_TABLET | Freq: Once | ORAL | Status: AC | PRN
  Administered 2020-05-12: 650 mg via ORAL
  Filled 2020-05-12: qty 2

## 2020-05-12 MED ORDER — DOXYCYCLINE HYCLATE 100 MG PO CAPS: 100.0000 mg | ORAL_CAPSULE | Freq: Two times a day (BID) | ORAL | 0 refills | Status: AC

## 2020-05-12 MED ORDER — ALBUTEROL SULFATE HFA 108 (90 BASE) MCG/ACT IN AERS
INHALATION_SPRAY | RESPIRATORY_TRACT | Status: AC
  Administered 2020-05-12: 4 via RESPIRATORY_TRACT
  Filled 2020-05-12: qty 6.7

## 2020-05-12 MED ORDER — SODIUM CHLORIDE 0.9 % IV SOLN
1.0000 g | Freq: Once | INTRAVENOUS | Status: AC
  Administered 2020-05-12: 1 g via INTRAVENOUS
  Filled 2020-05-12: qty 10

## 2020-05-12 MED ORDER — SODIUM CHLORIDE 0.9 % IV SOLN
500.0000 mg | Freq: Once | INTRAVENOUS | Status: AC
  Administered 2020-05-12: 500 mg via INTRAVENOUS
  Filled 2020-05-12: qty 500

## 2020-05-12 NOTE — ED Triage Notes (Signed)
She c/o cough/congestion/fevers since this Wed. She is flushed and in no distress. She states her cough is productive of "dark stuff".

## 2020-05-12 NOTE — ED Provider Notes (Signed)
[facility] EMERGENCY DEPT Provider Note   CSN: [id number] Arrival date & time: 05/12/20  1130     History Chief Complaint  Patient presents with  . Shortness of Breath  . Fever    Joy Austin is a 40 y.o. female.  Presents to ER with concern for cough, congestion, fevers.  Symptoms ongoing since Wednesday.  Has had some rust colored sputum.  No frank blood.  Does feel somewhat short of breath.  No chest pain.  She denies any medical problems, no chronic medications.  Has not seen primary care doctor for this complaint yet.  HPI     Past Medical History:  Diagnosis Date  . H/O varicella     There are no problems to display for this patient.   Past Surgical History:  Procedure Laterality Date  . TONSILLECTOMY       OB History    Gravida  3   Para  2   Term  2   Preterm      AB  1   Living  2     SAB  1   IAB      Ectopic      Multiple      Live Births  2           Family History  Adopted: Yes    Social History   Tobacco Use  . Smoking status: Never Smoker  . Smokeless tobacco: Never Used  Substance Use Topics  . Alcohol use: No  . Drug use: No    Home Medications Prior to Admission medications   Medication Sig Start Date End Date Taking? Authorizing Provider  doxycycline (VIBRAMYCIN) 100 MG capsule Take 1 capsule (100 mg total) by mouth 2 (two) times daily for 7 days. 05/12/20 05/19/20 Yes Milagros Loll, MD  Prenatal Vit-Fe Fumarate-FA (PRENATAL MULTIVITAMIN) TABS Take 1 tablet by mouth at bedtime.    [provider]    Allergies    Patient has no known allergies.  Review of Systems   Review of Systems  Constitutional: Positive for chills, fatigue and fever.  HENT: Positive for congestion. Negative for ear pain and sore throat.   Eyes: Negative for pain and visual disturbance.  Respiratory: Positive for cough and shortness of breath.   Cardiovascular: Negative for chest pain and palpitations.   Gastrointestinal: Negative for abdominal pain and vomiting.  Genitourinary: Negative for dysuria and hematuria.  Musculoskeletal: Negative for arthralgias and back pain.  Skin: Negative for color change and rash.  Neurological: Negative for seizures and syncope.  All other systems reviewed and are negative.   Physical Exam Updated Vital Signs BP 104/68   Pulse 88   Temp (!) 103.2 F (39.6 C) (Oral)   Resp 16   LMP  (LMP Unknown)   SpO2 97%   Breastfeeding No   Physical Exam Vitals and nursing note reviewed.  Constitutional:      General: She is not in acute distress.    Appearance: She is well-developed.  HENT:     Head: Normocephalic and atraumatic.  Eyes:     Conjunctiva/sclera: Conjunctivae normal.  Cardiovascular:     Rate and Rhythm: Normal rate and regular rhythm.     Heart sounds: No murmur heard.   Pulmonary:     Effort: Pulmonary effort is normal.     Comments: Not in respiratory distress, no tachypnea, some crackles and mildly diminished breath sounds on right side Abdominal:     Palpations:  Abdomen is soft.     Tenderness: There is no abdominal tenderness.  Musculoskeletal:     Cervical back: Neck supple.  Skin:    General: Skin is warm and dry.  Neurological:     Mental Status: She is alert.     ED Results / Procedures / Treatments   Labs (all labs ordered are listed, but only abnormal results are displayed) Labs Reviewed  URINALYSIS, ROUTINE W REFLEX MICROSCOPIC - Abnormal; Notable for the following components:      Result Value   Specific Gravity, Urine <1.005 (*)    Ketones, ur 15 (*)    Protein, ur TRACE (*)    All other components within normal limits  CBC WITH DIFFERENTIAL/PLATELET - Abnormal; Notable for the following components:   WBC 19.2 (*)    HCT 35.4 (*)    Neutro Abs 17.1 (*)    Monocytes Absolute 1.2 (*)    Abs Immature Granulocytes 0.12 (*)    All other components within normal limits  BASIC METABOLIC PANEL - Abnormal;  Notable for the following components:   Sodium 134 (*)    Potassium 3.2 (*)    Glucose, Bld 130 (*)    Calcium 8.8 (*)    All other components within normal limits  RESP PANEL BY RT-PCR (FLU A&B, COVID) [id number]  PREGNANCY, URINE    EKG EKG Interpretation  Date/Time:  Saturday May 12 2020 12:02:07 EDT Ventricular Rate:  76 PR Interval:  116 QRS Duration: 76 QT Interval:  340 QTC Calculation: 382 R Axis:   67 Text Interpretation: Normal sinus rhythm Normal ECG Confirmed by Marianna Fuss ([id number]) on 05/12/2020 12:36:46 PM   Radiology DG Chest Port 1 View  Result Date: 05/12/2020 CLINICAL DATA:  40 year old with cough. EXAM: PORTABLE CHEST 1 VIEW COMPARISON:  None. FINDINGS: Single view of the chest demonstrates an opacity in the right mid lung. This area measures up to 5.3 cm and possibly within the right upper lobe. Left lung is clear. Heart size is within normal limits. The trachea is midline. Negative for a pneumothorax. No acute bone abnormality. IMPRESSION: Focal opacity in the right lung, possibly in the right upper lobe. Findings are most compatible with pneumonia but cannot exclude an underlying neoplastic process. Followup PA and lateral chest X-ray is recommended in 3-4 weeks following trial of antibiotic therapy to ensure resolution and exclude underlying malignancy. Electronically Signed   By: Richarda Overlie M.D.   On: 05/12/2020 13:19    Procedures Procedures   Medications Ordered in ED Medications  acetaminophen (TYLENOL) tablet 650 mg (650 mg Oral Given 05/12/20 1204)  ondansetron (ZOFRAN-ODT) disintegrating tablet 4 mg (4 mg Oral Given 05/12/20 1205)  albuterol (VENTOLIN HFA) 108 (90 Base) MCG/ACT inhaler 4 puff (4 puffs Inhalation Given 05/12/20 1238)  cefTRIAXone (ROCEPHIN) 1 g in sodium chloride 0.9 % 100 mL IVPB (1 g Intravenous New Bag/Given 05/12/20 1333)  azithromycin (ZITHROMAX) 500 mg in sodium chloride 0.9 % 250 mL IVPB (500 mg Intravenous New Bag/Given 05/12/20  1407)  sodium chloride 0.9 % bolus 500 mL (500 mLs Intravenous New Bag/Given 05/12/20 1333)    ED Course  I have reviewed the triage vital signs and the nursing notes.  Pertinent labs & imaging results that were available during my care of the patient were reviewed by me and considered in my medical decision making (see chart for details).    MDM Rules/Calculators/A&P  40 year old lady presenting to ER with concern for cough, congestion and fever.  On physical exam, patient noted to be febrile, tachycardic but otherwise well-appearing.  CXR concerning for lobar pneumonia.  White count elevated but labs otherwise stable.  Provided dose of IV antibiotics, fluids, antipyretic.  Her initial tachycardia resolved.  No hypoxia throughout ER stay, no significant increased work of breathing.  At present believe patient is appropriate for outpatient management.  Provided course of doxycycline, recommended close recheck with her primary care doctor.  Reviewed return precautions in detail with patient and husband at bedside, discharged.  After the discussed management above, the patient was determined to be safe for discharge.  The patient was in agreement with this plan and all questions regarding their care were answered.  ED return precautions were discussed and the patient will return to the ED with any significant worsening of condition.    Final Clinical Impression(s) / ED Diagnoses Final diagnoses:  Community acquired pneumonia of right middle lobe of lung    Rx / DC Orders ED Discharge Orders         Ordered    doxycycline (VIBRAMYCIN) 100 MG capsule  2 times daily        05/12/20 1531           Milagros Loll, MD 05/13/20 [phone]

## 2020-05-12 NOTE — Discharge Instructions (Signed)
Take antibiotic as prescribed.  If you develop difficulty breathing, vomiting, or other new concerning symptom, return to ER for reassessment.  Early next week please follow-up with your primary doctor for recheck.  In 3 to 4 weeks you should have a repeat chest x-ray to ensure resolution of your chest x-ray findings.

## 2020-10-11 DIAGNOSIS — R0981 Nasal congestion: Secondary | ICD-10-CM | POA: Diagnosis not present

## 2020-10-11 DIAGNOSIS — J069 Acute upper respiratory infection, unspecified: Secondary | ICD-10-CM | POA: Diagnosis not present

## 2020-10-11 DIAGNOSIS — R059 Cough, unspecified: Secondary | ICD-10-CM | POA: Diagnosis not present

## 2021-01-22 DIAGNOSIS — J069 Acute upper respiratory infection, unspecified: Secondary | ICD-10-CM | POA: Diagnosis not present

## 2021-01-22 DIAGNOSIS — R0981 Nasal congestion: Secondary | ICD-10-CM | POA: Diagnosis not present

## 2021-06-03 DIAGNOSIS — R5383 Other fatigue: Secondary | ICD-10-CM | POA: Diagnosis not present

## 2021-06-06 DIAGNOSIS — R42 Dizziness and giddiness: Secondary | ICD-10-CM | POA: Diagnosis not present

## 2021-06-19 DIAGNOSIS — E782 Mixed hyperlipidemia: Secondary | ICD-10-CM | POA: Insufficient documentation

## 2021-06-19 DIAGNOSIS — R42 Dizziness and giddiness: Secondary | ICD-10-CM | POA: Insufficient documentation

## 2021-06-19 NOTE — Progress Notes (Signed)
Patient referred by Joya Gaskins, * for dizziness  Subjective:   Joy Austin, female    DOB: [birth year], 41 y.o.   MRN: [id number]   Chief Complaint  Patient presents with   Dizziness   New Patient (Initial Visit)     HPI  41 y.o. Caucasian female with dizziness  Patient works 3 jobs, lives with husband and 2 kids.  She stays busy with work and day-to-day routine activities.  She does not do any regular exercise.  She used to play soccer earlier, but does not play complete soccer anymore.  For the last 3 weeks, she has noticed dizziness.  This is present when laying down, on turning sides, as well as standing up.  She has not had any presyncope or syncope.  She denies any chest pain, shortness of breath symptoms.   Blood pressure elevated today.  Her husband is an EMT, and has noticed elevated blood pressure at least once before.  Reviewed recent test results with the patient, details below.   Patient is adopted, therefore no family history available.   Past Medical History:  Diagnosis Date   H/O varicella      Past Surgical History:  Procedure Laterality Date   TONSILLECTOMY       Social History   Tobacco Use  Smoking Status Never  Smokeless Tobacco Never    Social History   Substance and Sexual Activity  Alcohol Use No     Family History  Adopted: Yes      Current Outpatient Medications:    Prenatal Vit-Fe Fumarate-FA (PRENATAL MULTIVITAMIN) TABS, Take 1 tablet by mouth at bedtime., Disp: , Rfl:    Cardiovascular and other pertinent studies:  Reviewed external labs and tests, independently interpreted  EKG 06/20/2021: Sinus rhythm 73 bpm  Low voltage in precordial leads  Recent labs: 06/03/2021: Glucose 83, BUN/Cr 11/0.77. EGFR >90. Na/K 138/4.1. Rest of the CMP normal H/H 14/41. MCV 93. Platelets 198 HbA1C 5.0% Chol 214, TG 88, HDL 64, LDL 144 TSH 1.6 normal    Review of Systems  Cardiovascular:  Negative for chest  pain, dyspnea on exertion, leg swelling, palpitations and syncope.  Neurological:  Positive for dizziness.         Vitals:   06/20/21 0914 06/20/21 0915  Resp:    Temp:    SpO2: 98% 98%   Orthostatic VS for the past 72 hrs (Last 3 readings):  Orthostatic BP Patient Position BP Location Cuff Size Orthostatic Pulse  06/20/21 0915 (!) 140/99 Standing Left Arm Normal 64  06/20/21 0914 145/85 Sitting Left Arm Normal 71  06/20/21 0913 149/85 Supine Left Arm Normal 74     Body mass index is 30.9 kg/m. Filed Weights   06/20/21 0913  Weight: 180 lb (81.6 kg)     Objective:   Physical Exam Vitals and nursing note reviewed.  Constitutional:      General: She is not in acute distress. Neck:     Vascular: No JVD.  Cardiovascular:     Rate and Rhythm: Normal rate and regular rhythm.     Heart sounds: Normal heart sounds. No murmur heard. Pulmonary:     Effort: Pulmonary effort is normal.     Breath sounds: Normal breath sounds. No wheezing or rales.  Musculoskeletal:     Right lower leg: No edema.     Left lower leg: No edema.           Visit diagnoses:   ICD-10-CM  1. Dizziness  R42 EKG 12-Lead    PCV ECHOCARDIOGRAM COMPLETE    2. Mixed hyperlipidemia  E78.2 PCV ECHOCARDIOGRAM COMPLETE    CT CARDIAC SCORING ([facility] LOCATIONS ONLY)    3. Elevated blood pressure reading without diagnosis of hypertension  R03.0 PCV ECHOCARDIOGRAM COMPLETE    CT CARDIAC SCORING ([facility] LOCATIONS ONLY)       Orders Placed This Encounter  Procedures   CT CARDIAC SCORING ([facility] LOCATIONS ONLY)   EKG 12-Lead   PCV ECHOCARDIOGRAM COMPLETE     Medication changes this visit: Medications Discontinued During This Encounter  Medication Reason   Prenatal Vit-Fe Fumarate-FA (PRENATAL MULTIVITAMIN) TABS        Assessment & Recommendations:    41 y.o. Caucasian female with dizziness  Dizziness: Normal physical exam, resting EKG.  Negative orthostatics. Blood pressure is elevated.  I  suspect her dizziness may actually be due to elevated blood pressure. Discussed diet and lifestyle.  She does admit to eating outside more frequently in the recent past. Discussed low-salt diet, home-cooked food, regular physical activity, sleep. She is not excited about starting any medication at this time. Initially decided to follow the above strategy and recheck blood pressure in 4 to 6 weeks.  We will check echocardiogram given suspicion of hypertension and dizziness.  Mixed hyperlipidemia: LDL 144.  Check calcium score scan for risk stratification.  Discussed Mediterranean style diet and regular physical activity.  Further recommendations after above testing  Thank you for referring the patient to [place]. Please feel free to contact with any questions.   Nigel Mormon, MD Pager: [phone] Office: [phone]

## 2021-06-20 ENCOUNTER — Ambulatory Visit: Payer: BC Managed Care – PPO | Admitting: Cardiology

## 2021-06-20 ENCOUNTER — Encounter: Payer: Self-pay | Admitting: Cardiology

## 2021-06-20 VITALS — Temp 97.8°F | Resp 16 | Ht 64.0 in | Wt 180.0 lb

## 2021-06-20 DIAGNOSIS — R030 Elevated blood-pressure reading, without diagnosis of hypertension: Secondary | ICD-10-CM | POA: Insufficient documentation

## 2021-06-20 DIAGNOSIS — E782 Mixed hyperlipidemia: Secondary | ICD-10-CM

## 2021-06-20 DIAGNOSIS — R42 Dizziness and giddiness: Secondary | ICD-10-CM

## 2021-07-15 DIAGNOSIS — Z1231 Encounter for screening mammogram for malignant neoplasm of breast: Secondary | ICD-10-CM | POA: Diagnosis not present

## 2021-07-16 DIAGNOSIS — R059 Cough, unspecified: Secondary | ICD-10-CM | POA: Diagnosis not present

## 2021-07-16 DIAGNOSIS — J40 Bronchitis, not specified as acute or chronic: Secondary | ICD-10-CM | POA: Diagnosis not present

## 2021-07-25 ENCOUNTER — Other Ambulatory Visit: Payer: BC Managed Care – PPO

## 2021-07-29 DIAGNOSIS — Z6830 Body mass index (BMI) 30.0-30.9, adult: Secondary | ICD-10-CM | POA: Diagnosis not present

## 2021-07-29 DIAGNOSIS — Z01419 Encounter for gynecological examination (general) (routine) without abnormal findings: Secondary | ICD-10-CM | POA: Diagnosis not present

## 2021-07-29 DIAGNOSIS — Z113 Encounter for screening for infections with a predominantly sexual mode of transmission: Secondary | ICD-10-CM | POA: Diagnosis not present

## 2021-07-29 DIAGNOSIS — Z124 Encounter for screening for malignant neoplasm of cervix: Secondary | ICD-10-CM | POA: Diagnosis not present

## 2021-08-01 ENCOUNTER — Ambulatory Visit: Payer: BC Managed Care – PPO | Admitting: Cardiology

## 2021-08-21 ENCOUNTER — Ambulatory Visit
Admission: RE | Admit: 2021-08-21 | Discharge: 2021-08-21 | Disposition: A | Discharge status: Home / Self Care | Payer: BC Managed Care – PPO | Source: Ambulatory Visit | Attending: Cardiology | Admitting: Cardiology

## 2021-08-21 ENCOUNTER — Ambulatory Visit: Payer: BC Managed Care – PPO

## 2021-08-21 DIAGNOSIS — E782 Mixed hyperlipidemia: Secondary | ICD-10-CM | POA: Diagnosis not present

## 2021-08-21 DIAGNOSIS — R030 Elevated blood-pressure reading, without diagnosis of hypertension: Secondary | ICD-10-CM | POA: Diagnosis not present

## 2021-08-21 DIAGNOSIS — K449 Diaphragmatic hernia without obstruction or gangrene: Secondary | ICD-10-CM | POA: Diagnosis not present

## 2021-08-21 DIAGNOSIS — R42 Dizziness and giddiness: Secondary | ICD-10-CM | POA: Diagnosis not present

## 2021-08-28 ENCOUNTER — Ambulatory Visit: Payer: BC Managed Care – PPO | Admitting: Cardiology

## 2021-08-28 ENCOUNTER — Encounter: Payer: Self-pay | Admitting: Cardiology

## 2021-08-28 VITALS — BP 137/77 | HR 71 | Temp 98.0°F | Resp 16 | Ht 64.0 in | Wt 180.0 lb

## 2021-08-28 DIAGNOSIS — R030 Elevated blood-pressure reading, without diagnosis of hypertension: Secondary | ICD-10-CM | POA: Diagnosis not present

## 2021-08-28 DIAGNOSIS — E782 Mixed hyperlipidemia: Secondary | ICD-10-CM | POA: Diagnosis not present

## 2021-08-28 NOTE — Progress Notes (Signed)
Patient referred by No ref. provider found for dizziness  Subjective:   Joy Austin, female    DOB: 1980/10/29, 41 y.o. -> 41 y.o.   MRN: [id number]   Chief Complaint  Patient presents with   Dizziness   Follow-up    6 week     HPI  41 y.o. -> 41 y.o. Caucasian female with dizziness  Patient is doing better than last time. Blood pressure is improved with diet and lifestyle changes.   Initial consultation visit 06/20/2021: Patient works 3 jobs, lives with husband and 2 kids.  She stays busy with work and day-to-day routine activities.  She does not do any regular exercise.  She used to play soccer earlier, but does not play complete soccer anymore.  For the last 3 weeks, she has noticed dizziness.  This is present when laying down, on turning sides, as well as standing up.  She has not had any presyncope or syncope.  She denies any chest pain, shortness of breath symptoms.   Blood pressure elevated today.  Her husband is an EMT, and has noticed elevated blood pressure at least once before.  Reviewed recent test results with the patient, details below.   Patient is adopted, therefore no family history available.    Current Outpatient Medications:    Cholecalciferol (VITAMIN D3) 1.25 MG (50000 UT) CAPS, Take by mouth., Disp: , Rfl:    Cardiovascular and other pertinent studies:  Reviewed external labs and tests, independently interpreted  CT cardiac scoring 08/21/2021: Total calcium score of 0.  Echocardiogram 08/21/2021:  Normal LV systolic function with EF 67%. Left ventricle cavity is normal  in size. Normal left ventricular wall thickness. Normal global wall  motion. Normal diastolic filling pattern, normal LAP.  Structurally normal tricuspid valve with trace regurgitation. No evidence  of pulmonary hypertension.   EKG 06/20/2021: Sinus rhythm 73 bpm  Low voltage in precordial leads  Recent labs: 06/03/2021: Glucose 83, BUN/Cr 11/0.77. EGFR >90. Na/K 138/4.1. Rest of the CMP  normal H/H 14/41. MCV 93. Platelets 198 HbA1C 5.0% Chol 214, TG 88, HDL 64, LDL 144 TSH 1.6 normal    Review of Systems  Cardiovascular:  Negative for chest pain, dyspnea on exertion, leg swelling, palpitations and syncope.  Neurological:  Positive for dizziness.         Vitals:   08/28/21 0916  BP: 137/77  Pulse: 71  Resp: 16  Temp: 98 F (36.7 C)  SpO2: 99%   Body mass index is 30.9 kg/m. Filed Weights   08/28/21 0916  Weight: 180 lb (81.6 kg)     Objective:   Physical Exam Vitals and nursing note reviewed.  Constitutional:      General: She is not in acute distress. Neck:     Vascular: No JVD.  Cardiovascular:     Rate and Rhythm: Normal rate and regular rhythm.     Heart sounds: Normal heart sounds. No murmur heard. Pulmonary:     Effort: Pulmonary effort is normal.     Breath sounds: Normal breath sounds. No wheezing or rales.  Musculoskeletal:     Right lower leg: No edema.     Left lower leg: No edema.          Visit diagnoses:   ICD-10-CM   1. Mixed hyperlipidemia  E78.2     2. Elevated blood pressure reading without diagnosis of hypertension  R03.0           Assessment & Recommendations:    41 y.o. -> 41 y.o. Caucasian female  with dizziness  Dizziness: Improved with improvement in blood pressure. Okay to hold off antihypertensive therapy for now.  Continue diet and lifestyle modifications.  If SBO remains >130 mmHg in the ling term, could consider adding low dose diuretic or vasodilator agent.   Mixed hyperlipidemia: LDL 144.  Calcium score 0. Okay to hold off statin for now. Consider repeat calcium score in 5 years.   F/u as needed    Nigel Mormon, MD Pager: [phone] Office: [phone]

## 2022-01-15 IMAGING — DX DG CHEST 1V PORT
1 series · 1 of 1 positions shown · non-contrast
Comparison: None.

CLINICAL DATA: 40-year-old with cough.

EXAM:
PORTABLE CHEST 1 VIEW

[chest]
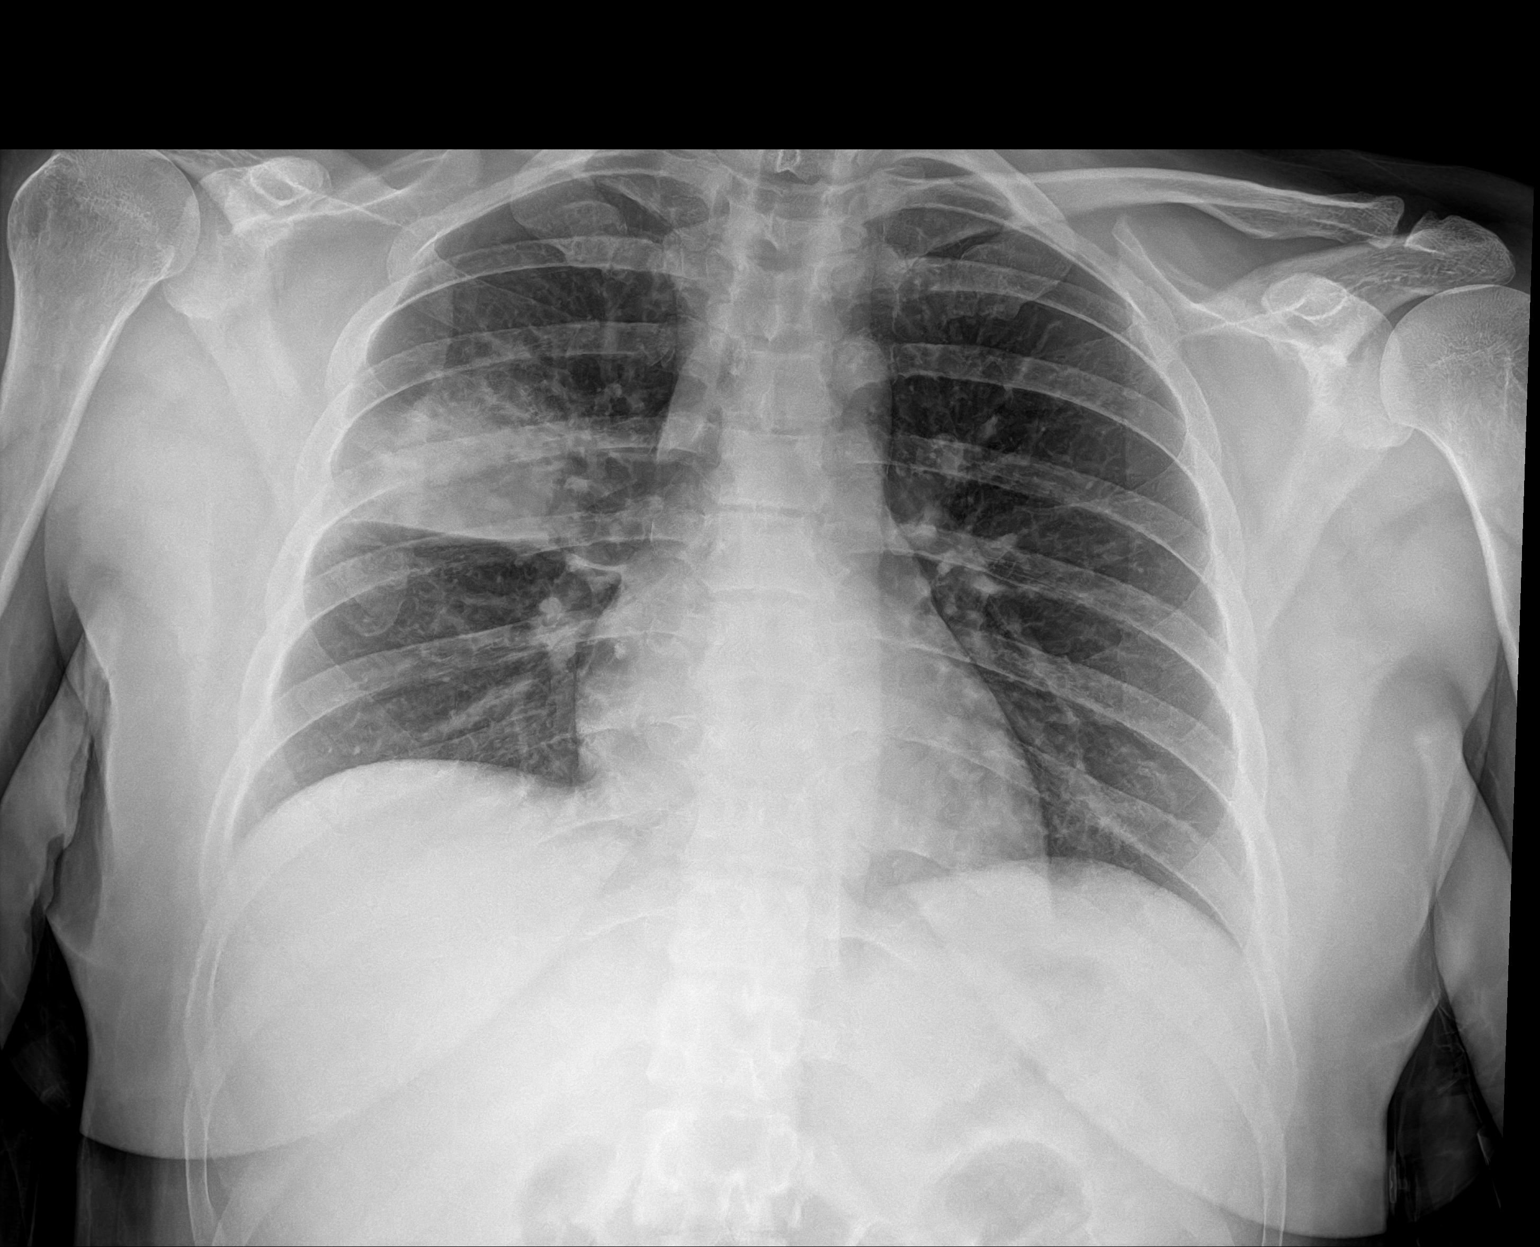

[1 of 1 positions shown; findings below may reference images not displayed]

FINDINGS: Single view of the chest demonstrates an opacity in the right mid
lung. This area measures up to 5.3 cm and possibly within the right
upper lobe. Left lung is clear. Heart size is within normal limits.
The trachea is midline. Negative for a pneumothorax. No acute bone
abnormality.
IMPRESSION: Focal opacity in the right lung, possibly in the right upper lobe.
Findings are most compatible with pneumonia but cannot exclude an
underlying neoplastic process. Followup PA and lateral chest X-ray
is recommended in 3-4 weeks following trial of antibiotic therapy to
ensure resolution and exclude underlying malignancy.

## 2022-09-10 DIAGNOSIS — Z1231 Encounter for screening mammogram for malignant neoplasm of breast: Secondary | ICD-10-CM | POA: Diagnosis not present
